# Patient Record
Sex: Female | Born: 2000 | Race: Black or African American | Hispanic: No | Marital: Single | Smoking: Never smoker
Health system: Southern US, Community
[De-identification: ages and names within clinical notes are randomized; demographics above are authoritative.]

## PROBLEM LIST (undated history)

## (undated) ENCOUNTER — Ambulatory Visit: Admission: EM | Source: Home / Self Care

## (undated) DIAGNOSIS — D649 Anemia, unspecified: Secondary | ICD-10-CM

## (undated) HISTORY — DX: Anemia, unspecified: D64.9

---
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
## 2019-11-17 ENCOUNTER — Encounter: Payer: Self-pay | Admitting: *Deleted

## 2019-12-24 ENCOUNTER — Other Ambulatory Visit (HOSPITAL_COMMUNITY)
Admission: RE | Admit: 2019-12-24 | Discharge: 2019-12-24 | Disposition: A | Discharge status: Home / Self Care | Payer: Medicaid Other | Source: Ambulatory Visit | Attending: Obstetrics and Gynecology | Admitting: Obstetrics and Gynecology

## 2019-12-24 ENCOUNTER — Ambulatory Visit (INDEPENDENT_AMBULATORY_CARE_PROVIDER_SITE_OTHER): Payer: Medicaid Other | Admitting: Obstetrics and Gynecology

## 2019-12-24 ENCOUNTER — Other Ambulatory Visit: Payer: Self-pay

## 2019-12-24 DIAGNOSIS — R635 Abnormal weight gain: Secondary | ICD-10-CM

## 2019-12-24 DIAGNOSIS — Z3202 Encounter for pregnancy test, result negative: Secondary | ICD-10-CM | POA: Diagnosis not present

## 2019-12-24 DIAGNOSIS — Z3009 Encounter for other general counseling and advice on contraception: Secondary | ICD-10-CM

## 2019-12-24 DIAGNOSIS — Z113 Encounter for screening for infections with a predominantly sexual mode of transmission: Secondary | ICD-10-CM | POA: Insufficient documentation

## 2019-12-24 LAB — POCT PREGNANCY, URINE: Preg Test, Ur: NEGATIVE

## 2019-12-24 NOTE — Progress Notes (Signed)
Obstetrics and Gynecology New Patient Evaluation  Appointment Date: 12/25/2019  OBGYN Clinic: [facility]  Primary Care Provider: [facility]  Referring Provider: [facility]  Chief Complaint: contraception management  History of Present Illness: Carrie Roman is a 19 y.o. African-American G0P (LMP: amenorrheic on depo provera), seen for the above chief complaint.  Patient referred from PCP for consideration of nexplanon. She has no contraindications to estrogen therapy   Review of Systems: Pertinent items are noted in HPI.   Past Medical History:  No past medical history on file.  Past Surgical History:  No past surgical history on file.  Past Obstetrical History:  OB History  Gravida Para Term Preterm AB Living  0 0 0 0 0 0  SAB TAB Ectopic Multiple Live Births  0 0 0 0 0    Past Gynecological History: As per HPI. Periods: rare with depo provera History of Pap Smear(s): No History of STI(s): No. She is currently using Depo-Provera for contraception. She only has used depo provera which she has been on for 2017; initially started for heavy and painful periods but she also uses it contraception now. She has gained weight and believes depo is contributing to it.    Social History:  Social History   Socioeconomic History  . Marital status: Single    Spouse name: Not on file  . Number of children: Not on file  . Years of education: Not on file  . Highest education level: Not on file  Occupational History  . Not on file  Tobacco Use  . Smoking status: Never Smoker  Substance and Sexual Activity  . Alcohol use: No  . Drug use: No  . Sexual activity: Not on file  Other Topics Concern  . Not on file  Social History Narrative  . Not on file   Social Determinants of Health   Financial Resource Strain:   . Difficulty of Paying Living Expenses:   Food Insecurity: No Food Insecurity   . Worried About Programme researcher, broadcasting/film/video in the Last Year: Never true  . Ran Out of Food in the Last Year: Never true  Transportation Needs: No Transportation Needs  . Lack of Transportation (Medical): No  . Lack of Transportation (Non-Medical): No  Physical Activity:   . Days of Exercise per Week:   . Minutes of Exercise per Session:   Stress:   . Feeling of Stress :   Social Connections:   . Frequency of Communication with Friends and Family:   . Frequency of Social Gatherings with Friends and Family:   . Attends Religious Services:   . Active Member of Clubs or Organizations:   . Attends Banker Meetings:   [name]:   Intimate Partner Violence:   . Fear of Current or Ex-Partner:   . Emotionally Abused:   Marland Kitchen Physically Abused:   . Sexually Abused:     Family History:  She denies any female cancers, bleeding or blood clotting disorders.   Medications Carrie Roman had no medications administered during this visit. No current outpatient medications on file.   No current facility-administered medications for this visit.    Allergies Septra [sulfamethoxazole-trimethoprim]   Physical Exam:  There were no vitals taken for this visit. There is no height or weight on file to calculate BMI. General appearance: Well nourished, well developed female in no acute distress.  Respiratory:  Normal respiratory  effort Neuro/Psych:  Normal mood and affect.  Skin:  Warm and dry.   Laboratory: none  Radiology: none  Assessment: pt doing well  Plan:  1. Screen for STD (sexually transmitted disease) Self swab done today - Cervicovaginal ancillary only( [place]) - HIV antibody (with reflex) - Hepatitis B Surface AntiGEN - Hepatitis C antibody, reflex - RPR  2. Weight gain - TSH - Hemoglobin A1c - Lipid panel  3. Encounter for other general counseling or advice on contraception Follow up vital signs Pt would like to do nexplanon.. she got depo in  April and isn't due for repeat dose until next month and pt would like to wait until then for nexplanon.   Orders Placed This Encounter  Procedures  . HIV antibody (with reflex)  . Hepatitis B Surface AntiGEN  . Hepatitis C antibody, reflex  . RPR  . TSH  . Hemoglobin A1c  . Lipid panel  . Pregnancy, urine POC    RTC 81m for nexplanon  Carrie Romans MD Attending Center for [facility])

## 2019-12-25 ENCOUNTER — Encounter: Payer: Self-pay | Admitting: Obstetrics and Gynecology

## 2019-12-25 DIAGNOSIS — R7303 Prediabetes: Secondary | ICD-10-CM | POA: Insufficient documentation

## 2019-12-25 DIAGNOSIS — E785 Hyperlipidemia, unspecified: Secondary | ICD-10-CM | POA: Insufficient documentation

## 2019-12-25 DIAGNOSIS — E786 Lipoprotein deficiency: Secondary | ICD-10-CM | POA: Insufficient documentation

## 2019-12-25 LAB — CERVICOVAGINAL ANCILLARY ONLY
Bacterial Vaginitis (gardnerella): NEGATIVE
Candida Glabrata: NEGATIVE
Candida Vaginitis: NEGATIVE
Chlamydia: NEGATIVE
Comment: NEGATIVE
Comment: NEGATIVE
Comment: NEGATIVE
Comment: NEGATIVE
Comment: NEGATIVE
Comment: NORMAL
Neisseria Gonorrhea: NEGATIVE
Trichomonas: NEGATIVE

## 2019-12-25 LAB — LIPID PANEL
Chol/HDL Ratio: 4.5 ratio — ABNORMAL HIGH (ref 0.0–4.4)
Cholesterol, Total: 153 mg/dL (ref 100–199)
HDL: 34 mg/dL — ABNORMAL LOW (ref >39)
LDL Chol Calc (NIH): 100 mg/dL — ABNORMAL HIGH (ref 0–99)
Triglycerides: 103 mg/dL (ref 0–149)
VLDL Cholesterol Cal: 19 mg/dL (ref 5–40)

## 2019-12-25 LAB — HEMOGLOBIN A1C
Est. average glucose Bld gHb Est-mCnc: 117 mg/dL
Hgb A1c MFr Bld: 5.7 % — ABNORMAL HIGH (ref 4.8–5.6)

## 2019-12-25 LAB — RPR: RPR Ser Ql: NONREACTIVE

## 2019-12-25 LAB — HEPATITIS B SURFACE ANTIGEN: Hepatitis B Surface Ag: NEGATIVE

## 2019-12-25 LAB — HIV ANTIBODY (ROUTINE TESTING W REFLEX): HIV Screen 4th Generation wRfx: NONREACTIVE

## 2019-12-25 LAB — TSH: TSH: 1.67 u[IU]/mL (ref 0.450–4.500)

## 2020-01-30 ENCOUNTER — Ambulatory Visit: Payer: Medicaid Other | Admitting: Obstetrics and Gynecology

## 2020-07-21 ENCOUNTER — Other Ambulatory Visit: Payer: Self-pay

## 2020-07-21 ENCOUNTER — Ambulatory Visit
Admission: EM | Admit: 2020-07-21 | Discharge: 2020-07-21 | Disposition: A | Discharge status: Home / Self Care | Payer: Medicaid Other | Attending: Emergency Medicine | Admitting: Emergency Medicine

## 2020-07-21 DIAGNOSIS — R0981 Nasal congestion: Secondary | ICD-10-CM

## 2020-07-21 DIAGNOSIS — J069 Acute upper respiratory infection, unspecified: Secondary | ICD-10-CM | POA: Diagnosis not present

## 2020-07-21 DIAGNOSIS — Z1152 Encounter for screening for COVID-19: Secondary | ICD-10-CM | POA: Diagnosis not present

## 2020-07-21 MED ORDER — FLUTICASONE PROPIONATE 50 MCG/ACT NA SUSP: 1.0000 | Freq: Every day | NASAL | 0 refills | Status: DC

## 2020-07-21 NOTE — ED Triage Notes (Signed)
Pt presents to Urgent Care with c/o sore throat x 2 days and nasal congestion and HA since yesterday.

## 2020-07-21 NOTE — ED Provider Notes (Signed)
[facility]    CSN: [id number] Arrival date & time: 07/21/20  0849      History   Chief Complaint Chief Complaint  Patient presents with   Sore Throat   Nasal Congestion    HPI Carrie Roman is a 19 y.o. female presenting today for evaluation of sore throat and congestion.  Reports she has had symptoms for the past 2 days.  Sore throat has improved, but congestion has persisted.  Mild associated headaches.  Denies cough chest pain or shortness of breath.  Denies fevers chills or body aches.  Denies known Covid exposure.  HPI  No past medical history on file.  Patient Active Problem List   Diagnosis Date Noted   Pre-diabetes 12/25/2019   Hyperlipidemia 12/25/2019   Low HDL (under 40) 12/25/2019    No past surgical history on file.  OB History    Gravida  0   Para  0   Term  0   Preterm  0   AB  0   Living  0     SAB  0   IAB  0   Ectopic  0   Multiple  0   Live Births  0            Home Medications    Prior to Admission medications   Medication Sig Start Date End Date Taking? Authorizing Provider  fluticasone (FLONASE) 50 MCG/ACT nasal spray Place 1-2 sprays into both nostrils daily. 07/21/20  Yes Torrion Witter, Junius Creamer, PA-C    Family History Family History  Problem Relation Age of Onset   Asthma Mother    Healthy Father     Social History Social History   Tobacco Use   Smoking status: Never Smoker   Smokeless tobacco: Former Forensic psychologist Use: Never used  Substance Use Topics   Alcohol use: No   Drug use: No     Allergies   Septra [sulfamethoxazole-trimethoprim]   Review of Systems Review of Systems  Constitutional: Negative for activity change, appetite change, chills, fatigue and fever.  HENT: Positive for congestion, rhinorrhea, sinus pressure and sore throat. Negative for ear pain and trouble swallowing.   Eyes: Negative for discharge and redness.  Respiratory: Negative for chest  tightness and shortness of breath.   Cardiovascular: Negative for chest pain.  Gastrointestinal: Negative for abdominal pain, diarrhea, nausea and vomiting.  Musculoskeletal: Negative for myalgias.  Skin: Negative for rash.  Neurological: Positive for headaches. Negative for dizziness and light-headedness.     Physical Exam Triage Vital Signs ED Triage Vitals  Enc Vitals Group     BP 07/21/20 1015 120/75     Pulse Rate 07/21/20 1015 97     Resp 07/21/20 1015 20     Temp 07/21/20 1015 98.3 F (36.8 C)     Temp Source 07/21/20 1015 Oral     SpO2 07/21/20 1015 96 %     Weight 07/21/20 1020 220 lb (99.8 kg)     Height 07/21/20 1020 5\' 8"  (1.727 m)     Head Circumference --      Peak Flow --      Pain Score 07/21/20 1020 0     Pain Loc --      Pain Edu? --      Excl. in GC? --    No data found.  Updated Vital Signs BP 120/75 (BP Location: Left Arm)    Pulse 97    Temp  98.3 F (36.8 C) (Oral)    Resp 20    Ht 5\' 8"  (1.727 m)    Wt 220 lb (99.8 kg)    LMP  (LMP Unknown) Comment: Stopped Depo in October 2021. No regular periods while using, only spotting. Last spotting in August 2021.   SpO2 96%    BMI 33.45 kg/m   Visual Acuity Right Eye Distance:   Left Eye Distance:   Bilateral Distance:    Right Eye Near:   Left Eye Near:    Bilateral Near:     Physical Exam Vitals and nursing note reviewed.  Constitutional:      Appearance: She is well-developed and well-nourished.     Comments: No acute distress  HENT:     Head: Normocephalic and atraumatic.     Ears:     Comments: Bilateral ears without tenderness to palpation of external auricle, tragus and mastoid, EAC's without erythema or swelling, TM's with good bony landmarks and cone of light. Non erythematous.     Nose: Nose normal.     Mouth/Throat:     Comments: Oral mucosa pink and moist, no tonsillar enlargement or exudate. Posterior pharynx patent and nonerythematous, no uvula deviation or swelling. Normal  phonation. Eyes:     Conjunctiva/sclera: Conjunctivae normal.  Cardiovascular:     Rate and Rhythm: Normal rate.  Pulmonary:     Effort: Pulmonary effort is normal. No respiratory distress.     Comments: Breathing comfortably at rest, CTABL, no wheezing, rales or other adventitious sounds auscultated Abdominal:     General: There is no distension.  Musculoskeletal:        General: Normal range of motion.     Cervical back: Neck supple.  Skin:    General: Skin is warm and dry.  Neurological:     Mental Status: She is alert and oriented to person, place, and time.  Psychiatric:        Mood and Affect: Mood and affect normal.      UC Treatments / Results  Labs (all labs ordered are listed, but only abnormal results are displayed) Labs Reviewed  NOVEL CORONAVIRUS, NAA    EKG   Radiology No results found.  Procedures Procedures (including critical care time)  Medications Ordered in UC Medications - No data to display  Initial Impression / Assessment and Plan / UC Course  I have reviewed the triage vital signs and the nursing notes.  Pertinent labs & imaging results that were available during my care of the patient were reviewed by me and considered in my medical decision making (see chart for details).     Covid test pending, recommending symptomatic and supportive care for nasal congestion, suspect likely viral etiology.  Monitor for gradual resolution.  Discussed strict return precautions. Patient verbalized understanding and is agreeable with plan.  Final Clinical Impressions(s) / UC Diagnoses   Final diagnoses:  Encounter for screening for COVID-19  Nasal congestion  Viral URI     Discharge Instructions     Covid test pending, monitor my chart for results Flonase nasal spray for congestion or may use over-the-counter Sudafed, Mucinex, Zyrtec or Claritin as Follow-up if not improving or worsening    ED Prescriptions    Medication Sig Dispense Auth.  Provider   fluticasone (FLONASE) 50 MCG/ACT nasal spray Place 1-2 sprays into both nostrils daily. 16 g Netta Fodge, Nageezi C, PA-C     PDMP not reviewed this encounter.   Villa Rodriguez, PA-C 07/21/20 1105

## 2020-07-21 NOTE — Discharge Instructions (Signed)
Covid test pending, monitor my chart for results Flonase nasal spray for congestion or may use over-the-counter Sudafed, Mucinex, Zyrtec or Claritin as Follow-up if not improving or worsening

## 2020-07-22 LAB — NOVEL CORONAVIRUS, NAA: SARS-CoV-2, NAA: DETECTED — AB

## 2020-07-22 LAB — SARS-COV-2, NAA 2 DAY TAT

## 2020-09-30 ENCOUNTER — Other Ambulatory Visit: Payer: Self-pay

## 2020-09-30 ENCOUNTER — Ambulatory Visit (HOSPITAL_COMMUNITY)
Admission: EM | Admit: 2020-09-30 | Discharge: 2020-09-30 | Disposition: A | Discharge status: Home / Self Care | Payer: Medicaid Other | Attending: Student | Admitting: Student

## 2020-09-30 ENCOUNTER — Encounter (HOSPITAL_COMMUNITY): Payer: Self-pay

## 2020-09-30 DIAGNOSIS — J029 Acute pharyngitis, unspecified: Secondary | ICD-10-CM | POA: Diagnosis not present

## 2020-09-30 DIAGNOSIS — Z113 Encounter for screening for infections with a predominantly sexual mode of transmission: Secondary | ICD-10-CM | POA: Diagnosis present

## 2020-09-30 DIAGNOSIS — Z8616 Personal history of COVID-19: Secondary | ICD-10-CM

## 2020-09-30 DIAGNOSIS — R7303 Prediabetes: Secondary | ICD-10-CM

## 2020-09-30 DIAGNOSIS — Z3202 Encounter for pregnancy test, result negative: Secondary | ICD-10-CM

## 2020-09-30 DIAGNOSIS — Z112 Encounter for screening for other bacterial diseases: Secondary | ICD-10-CM

## 2020-09-30 LAB — POC URINE PREG, ED: Preg Test, Ur: NEGATIVE

## 2020-09-30 LAB — POCT RAPID STREP A, ED / UC: Streptococcus, Group A Screen (Direct): NEGATIVE

## 2020-09-30 LAB — HIV ANTIBODY (ROUTINE TESTING W REFLEX): HIV Screen 4th Generation wRfx: NONREACTIVE

## 2020-09-30 MED ORDER — PREDNISONE 20 MG PO TABS: 40.0000 mg | ORAL_TABLET | Freq: Every day | ORAL | 0 refills | Status: AC

## 2020-09-30 NOTE — ED Provider Notes (Signed)
[facility]    CSN: [id number] Arrival date & time: 09/30/20  1817      History   Chief Complaint Chief Complaint  Patient presents with  . Sore Throat  . Exposure to STD    HPI Carrie Roman is a 20 y.o. female presenting with URI symptoms and STI check. History prediabetes, hyperlipidemia.  -Notes sore throat and loss of voice x3 days. Difficulty swallowing. Denies fevers/chills, n/v/d, shortness of breath, chest pain, cough, congestion, facial pain, teeth pain, headaches, loss of taste/smell, swollen lymph nodes, ear pain. Notes history covid-19 3 years ago. -She was stopped on Depo 5 months ago (04/2020) and has not had a period since then. Requesting pregnancy test today. -Requesting STI panel today. Denies symptoms, exposure. Denies hematuria, dysuria, frequency, urgency, back pain, n/v/d/abd pain, fevers/chills, abdnormal vaginal discharge.     HPI  History reviewed. No pertinent past medical history.  Patient Active Problem List   Diagnosis Date Noted  . Pre-diabetes 12/25/2019  . Hyperlipidemia 12/25/2019  . Low HDL (under 40) 12/25/2019    History reviewed. No pertinent surgical history.  OB History    Gravida  0   Para  0   Term  0   Preterm  0   AB  0   Living  0     SAB  0   IAB  0   Ectopic  0   Multiple  0   Live Births  0            Home Medications    Prior to Admission medications   Medication Sig Start Date End Date Taking? Authorizing Provider  predniSONE (DELTASONE) 20 MG tablet Take 2 tablets (40 mg total) by mouth daily for 5 days. 09/30/20 10/05/20 Yes Rhys Martini, PA-C  fluticasone (FLONASE) 50 MCG/ACT nasal spray Place 1-2 sprays into both nostrils daily. 07/21/20   Wieters, Junius Creamer, PA-C    Family History Family History  Problem Relation Age of Onset  . Asthma Mother   . Healthy Father     Social History Social History   Tobacco Use  . Smoking status: Never Smoker  . Smokeless tobacco:  Former Clinical biochemist  . Vaping Use: Never used  Substance Use Topics  . Alcohol use: No  . Drug use: No     Allergies   Septra [sulfamethoxazole-trimethoprim]   Review of Systems Review of Systems  Constitutional: Negative for appetite change, chills and fever.  HENT: Positive for sore throat and trouble swallowing. Negative for congestion, ear pain, rhinorrhea, sinus pressure, sinus pain and voice change.   Eyes: Negative for redness and visual disturbance.  Respiratory: Negative for cough, chest tightness, shortness of breath and wheezing.   Cardiovascular: Negative for chest pain and palpitations.  Gastrointestinal: Negative for abdominal pain, constipation, diarrhea, nausea and vomiting.  Genitourinary: Negative for dysuria, frequency and urgency.  Musculoskeletal: Negative for myalgias.  Neurological: Negative for dizziness, weakness and headaches.  Psychiatric/Behavioral: Negative for confusion.  All other systems reviewed and are negative.    Physical Exam Triage Vital Signs ED Triage Vitals  Enc Vitals Group     BP      Pulse      Resp      Temp      Temp src      SpO2      Weight      Height      Head Circumference      Peak Flow  Pain Score      Pain Loc      Pain Edu?      Excl. in GC?    No data found.  Updated Vital Signs BP 108/72 (BP Location: Left Arm)   Pulse 100   Temp 99.7 F (37.6 C) (Oral)   Resp 16   LMP  (LMP Unknown)   SpO2 100%   Visual Acuity Right Eye Distance:   Left Eye Distance:   Bilateral Distance:    Right Eye Near:   Left Eye Near:    Bilateral Near:     Physical Exam Vitals reviewed.  Constitutional:      General: She is not in acute distress.    Appearance: Normal appearance. She is not ill-appearing.  HENT:     Head: Normocephalic and atraumatic.     Right Ear: Hearing, tympanic membrane, ear canal and external ear normal. No swelling or tenderness. There is no impacted cerumen. No mastoid  tenderness. Tympanic membrane is not perforated, erythematous, retracted or bulging.     Left Ear: Hearing, tympanic membrane, ear canal and external ear normal. No swelling or tenderness. There is no impacted cerumen. No mastoid tenderness. Tympanic membrane is not perforated, erythematous, retracted or bulging.     Nose:     Right Sinus: No maxillary sinus tenderness or frontal sinus tenderness.     Left Sinus: No maxillary sinus tenderness or frontal sinus tenderness.     Mouth/Throat:     Mouth: Mucous membranes are moist.     Pharynx: Oropharynx is clear. Uvula midline. Posterior oropharyngeal erythema present. No oropharyngeal exudate.     Tonsils: No tonsillar exudate. 2+ on the right. 2+ on the left.     Comments: No pharyngeal swelling, discharge, abscess. Cardiovascular:     Rate and Rhythm: Normal rate and regular rhythm.     Heart sounds: Normal heart sounds.  Pulmonary:     Breath sounds: Normal breath sounds and air entry. No wheezing, rhonchi or rales.  Chest:     Chest wall: No tenderness.  Abdominal:     General: Abdomen is flat. Bowel sounds are normal.     Tenderness: There is no abdominal tenderness. There is no guarding or rebound.  Lymphadenopathy:     Cervical: No cervical adenopathy.  Neurological:     General: No focal deficit present.     Mental Status: She is alert and oriented to person, place, and time.  Psychiatric:        Attention and Perception: Attention and perception normal.        Mood and Affect: Mood and affect normal.        Behavior: Behavior normal. Behavior is cooperative.        Thought Content: Thought content normal.        Judgment: Judgment normal.      UC Treatments / Results  Labs (all labs ordered are listed, but only abnormal results are displayed) Labs Reviewed  CULTURE, GROUP A STREP ([facility])  RPR  HIV ANTIBODY (ROUTINE TESTING W REFLEX)  POC URINE PREG, ED  POCT RAPID STREP A, ED / UC    EKG   Radiology No results  found.  Procedures Procedures (including critical care time)  Medications Ordered in UC Medications - No data to display  Initial Impression / Assessment and Plan / UC Course  I have reviewed the triage vital signs and the nursing notes.  Pertinent labs & imaging results that were available during my  care of the patient were reviewed by me and considered in my medical decision making (see chart for details).      This patient is a 20 year old female presenting with pharyngitis and STI screen. Today she is  afebrile nontachycardic nontachypneic, oxygenating well on room air.   Rapid strep negative. Culture sent. covid deferred given history covid-19 infection within the last 3 months.  For pharyngitis, plan to treat with prednisone as below. Prediabetes is well controlled with last A1c of 5.7.  Tylenol and ibuprofen for discomfort.  Will send self swab for G/C, trich, yeast, BV, HIV, RPR. Patient denies STD symptoms or risk. Abstain until negative result.  Urine pregnancy negative.  Return precautions discussed.  This chart was dictated using voice recognition software, Dragon. Despite the best efforts of this provider to proofread and correct errors, errors may still occur which can change documentation meaning.   Final Clinical Impressions(s) / UC Diagnoses   Final diagnoses:  Viral pharyngitis  Screening for streptococcal infection  History of 2019 novel coronavirus disease (COVID-19)  Routine screening for STI (sexually transmitted infection)  Negative pregnancy test  Prediabetes     Discharge Instructions     -Start the steroid-prednisone [facility]), 2 pills taken together in the morning for 5 days in a row.  This will help with the swelling and inflammation of your throat and tonsils -You can also take Tylenol and ibuprofen for discomfort.  You can take ibuprofen up to 800 mg 3 times daily with food, and Tylenol 1000 mg up to 3 times daily. -Come back and see [place]  if you develop worsening symptoms, like shortness of breath, fevers and chills that won't  be reduced by Tylenol, chest pain, abdominal pain, etc. -We are also screening you for gonorrhea, chlamydia, trichomonas, BV, yeast, HIV, syphilis.  We will call you if anything is positive, and can send treatment at that time.  The results will also automatically go to your MyChart. -Your pregnancy test is negative today.    ED Prescriptions    Medication Sig Dispense Auth. Provider   predniSONE (DELTASONE) 20 MG tablet Take 2 tablets (40 mg total) by mouth daily for 5 days. 10 tablet Rhys Martini, PA-C     PDMP not reviewed this encounter.   Rhys Martini, PA-C 09/30/20 1942

## 2020-09-30 NOTE — Discharge Instructions (Addendum)
-  Start the steroid-prednisone [facility]), 2 pills taken together in the morning for 5 days in a row.  This will help with the swelling and inflammation of your throat and tonsils -You can also take Tylenol and ibuprofen for discomfort.  You can take ibuprofen up to 800 mg 3 times daily with food, and Tylenol 1000 mg up to 3 times daily. -Come back and see [place] if you develop worsening symptoms, like shortness of breath, fevers and chills that won't  be reduced by Tylenol, chest pain, abdominal pain, etc. -We are also screening you for gonorrhea, chlamydia, trichomonas, BV, yeast, HIV, syphilis.  We will call you if anything is positive, and can send treatment at that time.  The results will also automatically go to your MyChart. -Your pregnancy test is negative today.

## 2020-09-30 NOTE — ED Triage Notes (Signed)
Pt present a sore throat for a couple of days with loss voice. Pt states having difficulty swallowing. She also would like to be check for any STD. Pt denies any exposure but would like to be checked.

## 2020-10-01 LAB — CERVICOVAGINAL ANCILLARY ONLY
Bacterial Vaginitis (gardnerella): NEGATIVE
Candida Glabrata: NEGATIVE
Candida Vaginitis: NEGATIVE
Chlamydia: NEGATIVE
Comment: NEGATIVE
Comment: NEGATIVE
Comment: NEGATIVE
Comment: NEGATIVE
Comment: NEGATIVE
Comment: NORMAL
Neisseria Gonorrhea: NEGATIVE
Trichomonas: NEGATIVE

## 2020-10-01 LAB — CULTURE, GROUP A STREP (THRC)

## 2020-10-01 LAB — RPR: RPR Ser Ql: NONREACTIVE

## 2020-10-02 LAB — CULTURE, GROUP A STREP (THRC)

## 2020-10-03 LAB — CULTURE, GROUP A STREP (THRC)

## 2020-11-07 ENCOUNTER — Ambulatory Visit (HOSPITAL_COMMUNITY)
Admission: EM | Admit: 2020-11-07 | Discharge: 2020-11-07 | Disposition: A | Discharge status: Home / Self Care | Payer: Medicaid Other | Attending: Urgent Care | Admitting: Urgent Care

## 2020-11-07 ENCOUNTER — Encounter (HOSPITAL_COMMUNITY): Payer: Self-pay

## 2020-11-07 ENCOUNTER — Other Ambulatory Visit: Payer: Self-pay

## 2020-11-07 DIAGNOSIS — J069 Acute upper respiratory infection, unspecified: Secondary | ICD-10-CM | POA: Diagnosis not present

## 2020-11-07 DIAGNOSIS — R059 Cough, unspecified: Secondary | ICD-10-CM | POA: Diagnosis not present

## 2020-11-07 DIAGNOSIS — U071 COVID-19: Secondary | ICD-10-CM | POA: Insufficient documentation

## 2020-11-07 DIAGNOSIS — R0982 Postnasal drip: Secondary | ICD-10-CM | POA: Diagnosis not present

## 2020-11-07 DIAGNOSIS — R1110 Vomiting, unspecified: Secondary | ICD-10-CM | POA: Diagnosis present

## 2020-11-07 MED ORDER — PSEUDOEPHEDRINE HCL 60 MG PO TABS: 60.0000 mg | ORAL_TABLET | Freq: Three times a day (TID) | ORAL | 0 refills | Status: DC | PRN

## 2020-11-07 MED ORDER — PROMETHAZINE-DM 6.25-15 MG/5ML PO SYRP: 5.0000 mL | ORAL_SOLUTION | Freq: Every evening | ORAL | 0 refills | Status: DC | PRN

## 2020-11-07 MED ORDER — CETIRIZINE HCL 10 MG PO TABS: 10.0000 mg | ORAL_TABLET | Freq: Every day | ORAL | 0 refills | Status: DC

## 2020-11-07 MED ORDER — FLUTICASONE PROPIONATE 50 MCG/ACT NA SUSP: 1.0000 | Freq: Every day | NASAL | 0 refills | Status: DC

## 2020-11-07 NOTE — ED Triage Notes (Signed)
Pt presents with non productive cough, sore throat, and vomiting since last night.

## 2020-11-07 NOTE — Discharge Instructions (Addendum)

## 2020-11-07 NOTE — ED Provider Notes (Signed)
[facility]   MRN: [id number] DOB: 2001-04-19  Subjective:   Carrie Roman is a 20 y.o. female presenting for acute onset of dry hacking cough, throat pain, scratchy throat, post office emesis.  States that she was seen about a month ago, was given a steroid course which helped temporarily.  She was tested for strep and was negative.  Denies chest pain, shortness of breath.  No history of respiratory disorders, asthma, she is not a smoker.  She did take an at home COVID test and was negative.  Would like to make sure that is correct.  She is COVID vaccinated but no booster. Has not had fevers or body aches.    No current facility-administered medications for this encounter.  Current Outpatient Medications:  .  fluticasone (FLONASE) 50 MCG/ACT nasal spray, Place 1-2 sprays into both nostrils daily., Disp: 16 g, Rfl: 0   Allergies  Allergen Reactions  . Septra [Sulfamethoxazole-Trimethoprim] Rash    History reviewed. No pertinent past medical history.   History reviewed. No pertinent surgical history.  Family History  Problem Relation Age of Onset  . Asthma Mother   . Healthy Father     Social History   Tobacco Use  . Smoking status: Never Smoker  . Smokeless tobacco: Former Clinical biochemist  . Vaping Use: Never used  Substance Use Topics  . Alcohol use: No  . Drug use: No    ROS   Objective:   Vitals: BP 133/80 (BP Location: Right Arm)   Pulse 86   Temp 98.9 F (37.2 C) (Oral)   Resp 17   SpO2 94%   Physical Exam Constitutional:      General: She is not in acute distress.    Appearance: Normal appearance. She is well-developed. She is not ill-appearing, toxic-appearing or diaphoretic.  HENT:     Head: Normocephalic and atraumatic.     Right Ear: External ear normal.     Left Ear: External ear normal.     Nose: Nose normal.     Mouth/Throat:     Mouth: Mucous membranes are moist.     Pharynx: No oropharyngeal exudate or posterior  oropharyngeal erythema.     Comments: Thick layer of post-nasal drainage overlying pharynx.  Eyes:     General: No scleral icterus.       Right eye: No discharge.        Left eye: No discharge.     Extraocular Movements: Extraocular movements intact.     Conjunctiva/sclera: Conjunctivae normal.     Pupils: Pupils are equal, round, and reactive to light.  Cardiovascular:     Rate and Rhythm: Normal rate and regular rhythm.     Pulses: Normal pulses.     Heart sounds: Normal heart sounds. No murmur heard. No friction rub. No gallop.   Pulmonary:     Effort: Pulmonary effort is normal. No respiratory distress.     Breath sounds: Normal breath sounds. No stridor. No wheezing, rhonchi or rales.  Skin:    General: Skin is warm and dry.     Findings: No rash.  Neurological:     Mental Status: She is alert and oriented to person, place, and time.  Psychiatric:        Mood and Affect: Mood normal.        Behavior: Behavior normal.        Thought Content: Thought content normal.        Judgment:  Judgment normal.     Assessment and Plan :   PDMP not reviewed this encounter.  1. Viral URI with cough   2. Post-nasal drainage     Will manage for viral illness such as viral URI, viral syndrome, viral rhinitis, COVID-19. Counseled patient on nature of COVID-19 including modes of transmission, diagnostic testing, management and supportive care.  Offered scripts for symptomatic relief. COVID 19 testing is pending. Counseled patient on potential for adverse effects with medications prescribed/recommended today, ER and return-to-clinic precautions discussed, patient verbalized understanding.     Wallis Bamberg, [place] 11/07/20 [phone]

## 2020-11-08 LAB — SARS CORONAVIRUS 2 (TAT 6-24 HRS): SARS Coronavirus 2: POSITIVE — AB

## 2020-11-09 ENCOUNTER — Telehealth: Payer: Self-pay

## 2020-11-09 NOTE — Telephone Encounter (Signed)
Called to discuss with patient about COVID-19 symptoms and the use of one of the available treatments for those with mild to moderate Covid symptoms and at a high risk of hospitalization.  Pt appears to qualify for outpatient treatment due to co-morbid conditions and/or a member of an at-risk group in accordance with the FDA Emergency Use Authorization.    Symptom onset: 11/07/20 Cough Vaccinated: Yes Booster? No Immunocompromised? No Qualifiers: Obesity  Unable to reach pt - Voice mailbox not set up, unable to leave message.   Esther Hardy

## 2021-05-07 ENCOUNTER — Other Ambulatory Visit: Payer: Self-pay

## 2021-05-07 ENCOUNTER — Emergency Department (HOSPITAL_COMMUNITY)
Admission: EM | Admit: 2021-05-07 | Discharge: 2021-05-07 | Discharge status: Against Medical Advice | Payer: Medicaid Other | Attending: Medical | Admitting: Medical

## 2021-05-07 ENCOUNTER — Emergency Department (HOSPITAL_COMMUNITY): Payer: Medicaid Other

## 2021-05-07 ENCOUNTER — Encounter (HOSPITAL_COMMUNITY): Payer: Self-pay | Admitting: Emergency Medicine

## 2021-05-07 ENCOUNTER — Other Ambulatory Visit (HOSPITAL_COMMUNITY): Payer: Self-pay

## 2021-05-07 DIAGNOSIS — R102 Pelvic and perineal pain: Secondary | ICD-10-CM | POA: Insufficient documentation

## 2021-05-07 DIAGNOSIS — Z5321 Procedure and treatment not carried out due to patient leaving prior to being seen by health care provider: Secondary | ICD-10-CM | POA: Insufficient documentation

## 2021-05-07 DIAGNOSIS — N939 Abnormal uterine and vaginal bleeding, unspecified: Secondary | ICD-10-CM | POA: Insufficient documentation

## 2021-05-07 LAB — I-STAT BETA HCG BLOOD, ED (MC, WL, AP ONLY): I-stat hCG, quantitative: <5 m[IU]/mL (ref <5)

## 2021-05-07 LAB — BASIC METABOLIC PANEL
Anion gap: 5 (ref 5–15)
BUN: 10 mg/dL (ref 6–20)
CO2: 24 mmol/L (ref 22–32)
Calcium: 9.3 mg/dL (ref 8.9–10.3)
Chloride: 107 mmol/L (ref 98–111)
Creatinine, Ser: 0.89 mg/dL (ref 0.44–1.00)
GFR, Estimated: >60 mL/min (ref >60)
Glucose, Bld: 94 mg/dL (ref 70–99)
Potassium: 3.7 mmol/L (ref 3.5–5.1)
Sodium: 136 mmol/L (ref 135–145)

## 2021-05-07 LAB — CBC WITH DIFFERENTIAL/PLATELET
Abs Immature Granulocytes: 0.01 10*3/uL (ref 0.00–0.07)
Basophils Absolute: 0.1 10*3/uL (ref 0.0–0.1)
Basophils Relative: 1 %
Eosinophils Absolute: 0.1 10*3/uL (ref 0.0–0.5)
Eosinophils Relative: 1 %
HCT: 36.5 % (ref 36.0–46.0)
Hemoglobin: 11.4 g/dL — ABNORMAL LOW (ref 12.0–15.0)
Immature Granulocytes: 0 %
Lymphocytes Relative: 39 %
Lymphs Abs: 3.1 10*3/uL (ref 0.7–4.0)
MCH: 26.7 pg (ref 26.0–34.0)
MCHC: 31.2 g/dL (ref 30.0–36.0)
MCV: 85.5 fL (ref 80.0–100.0)
Monocytes Absolute: 0.8 10*3/uL (ref 0.1–1.0)
Monocytes Relative: 10 %
Neutro Abs: 3.9 10*3/uL (ref 1.7–7.7)
Neutrophils Relative %: 49 %
Platelets: 354 10*3/uL (ref 150–400)
RBC: 4.27 MIL/uL (ref 3.87–5.11)
RDW: 13.2 % (ref 11.5–15.5)
WBC: 7.9 10*3/uL (ref 4.0–10.5)
nRBC: 0 % (ref 0.0–0.2)

## 2021-05-07 LAB — TYPE AND SCREEN
ABO/RH(D): O POS
Antibody Screen: NEGATIVE

## 2021-05-07 LAB — HIV ANTIBODY (ROUTINE TESTING W REFLEX): HIV Screen 4th Generation wRfx: NONREACTIVE

## 2021-05-07 NOTE — ED Triage Notes (Signed)
Pt reports heavy vaginal bleeding since yesterday.  Period started on 10/6.  Denies pain.

## 2021-05-07 NOTE — ED Provider Notes (Signed)
Emergency Medicine Provider Triage Evaluation Note  Carrie Roman , a 20 y.o. female  was evaluated in triage.  Pt complains of heavy vaginal bleeding since starting menstrual cycle 10/06. Stopped taking BC about 1 year ago and has been having irregular cycles since then however seems to have leveled out recently. LNMP prior to this one 09/02 however this has lasted longer and is much heavier. Going through 1 super tampon every 2 hours. No dizziness, lightheadedness, chest pain, SOB, pre syncope, syncope. Went to UC and had a negative preg test and sent here for further eval.   Review of Systems  Positive: + vaginal bleeding, cramping Negative: - syncope  Physical Exam  BP 129/71 (BP Location: Left Arm)   Pulse 82   Temp 99 F (37.2 C) (Oral)   Resp 16   SpO2 100%  Gen:   Awake, no distress   Resp:  Normal effort  MSK:   Moves extremities without difficulty  Other:    Medical Decision Making  Medically screening exam initiated at 4:13 PM.  Appropriate orders placed.  Payton Doughty Dygert was informed that the remainder of the evaluation will be completed by another provider, this initial triage assessment does not replace that evaluation, and the importance of remaining in the ED until their evaluation is complete.     Tanda Rockers, PA-C 05/07/21 1615    Benjiman Core, MD 05/07/21 [phone]

## 2021-05-07 NOTE — ED Notes (Signed)
Pt wanted to leave and left AMA.

## 2021-05-08 LAB — RPR: RPR Ser Ql: NONREACTIVE

## 2021-06-23 ENCOUNTER — Ambulatory Visit (INDEPENDENT_AMBULATORY_CARE_PROVIDER_SITE_OTHER): Payer: Medicaid Other | Admitting: Obstetrics and Gynecology

## 2021-06-23 ENCOUNTER — Encounter: Payer: Self-pay | Admitting: Obstetrics and Gynecology

## 2021-06-23 ENCOUNTER — Other Ambulatory Visit (HOSPITAL_COMMUNITY)
Admission: RE | Admit: 2021-06-23 | Discharge: 2021-06-23 | Disposition: A | Discharge status: Home / Self Care | Payer: Medicaid Other | Source: Ambulatory Visit | Attending: Obstetrics and Gynecology | Admitting: Obstetrics and Gynecology

## 2021-06-23 ENCOUNTER — Other Ambulatory Visit: Payer: Self-pay

## 2021-06-23 VITALS — BP 110/74 | HR 97 | Ht 68.0 in | Wt 239.0 lb

## 2021-06-23 DIAGNOSIS — Z01419 Encounter for gynecological examination (general) (routine) without abnormal findings: Secondary | ICD-10-CM

## 2021-06-23 NOTE — Progress Notes (Signed)
Subjective:     Carrie Roman is a 21 y.o. female P0 with LMP 06/16/21 and BMI 36 is here for a comprehensive physical exam. The patient reports no problems. She reports a monthly period lasting 10 days but admits that it varies in length. She was recently seen in the ED in October due to heavier cycle. She reports that per period in November lasted 5 days. She was previously using depo-provera but has discontinued it this past year. Her periods are starting to become a little more regular. Patient denies pelvic pain. She denies frequent headaches, or female pattern hair distribution. She is sexually active currently using condoms. Patient is considering Nexplanon for contraception  Past Medical History:  Diagnosis Date   Anemia    History reviewed. No pertinent surgical history. Family History  Problem Relation Age of Onset   Asthma Mother    Healthy Father     Social History   Socioeconomic History   Marital status: Single    Spouse name: Not on file   Number of children: Not on file   Years of education: Not on file   Highest education level: Not on file  Occupational History   Not on file  Tobacco Use   Smoking status: Never   Smokeless tobacco: Former  Building services engineer Use: Never used  Substance and Sexual Activity   Alcohol use: No   Drug use: No   Sexual activity: Yes    Birth control/protection: None  Other Topics Concern   Not on file  Social History Narrative   Not on file   Social Determinants of Health   Financial Resource Strain: Not on file  Food Insecurity: Not on file  Transportation Needs: Not on file  Physical Activity: Not on file  Stress: Not on file  Social Connections: Not on file  Intimate Partner Violence: Not on file   Health Maintenance  Topic Date Due   COVID-19 Vaccine (1) Never done   HPV VACCINES (1 - 2-dose series) Never done   Hepatitis C Screening  Never done   TETANUS/TDAP  Never done   PAP-Cervical Cytology Screening  Never  done   PAP SMEAR-Modifier  Never done   INFLUENZA VACCINE  Never done   HIV Screening  Completed   Pneumococcal Vaccine 16-40 Years old  Aged Out       Review of Systems Pertinent items noted in HPI and remainder of comprehensive ROS otherwise negative.   Objective:  Blood pressure 110/74, pulse 97, height 5\' 8"  (1.727 m), weight 239 lb (108.4 kg), last menstrual period 06/16/2021.   GENERAL: Well-developed, well-nourished female in no acute distress.  HEENT: Normocephalic, atraumatic. Sclerae anicteric.  NECK: Supple. Normal thyroid.  LUNGS: Clear to auscultation bilaterally.  HEART: Regular rate and rhythm. BREASTS: Symmetric in size. No palpable masses or lymphadenopathy, skin changes, or nipple drainage. ABDOMEN: Soft, nontender, nondistended. No organomegaly. PELVIC: Normal external female genitalia. Vagina is pink and rugated.  Normal discharge. Normal appearing cervix. Uterus is normal in size. No adnexal mass or tenderness. Chaperone present during the pelvic exam EXTREMITIES: No cyanosis, clubbing, or edema, 2+ distal pulses.     Assessment:    Healthy female exam.      Plan:    Pap smear collected STI testing per patient request TSH and CBC ordered Patient will be contacted with abnormal results RTC for Nexplanon insertion if desired See After Visit Summary for Counseling Recommendations

## 2021-06-23 NOTE — Progress Notes (Signed)
Pt states irregular cycles.  Last month pt states bleeding x 3 weeks- heavy.   Pt is not on BC at this time. Last BC was over a year ago- had Depo.

## 2021-06-24 LAB — CERVICOVAGINAL ANCILLARY ONLY
Chlamydia: NEGATIVE
Comment: NEGATIVE
Comment: NORMAL
Neisseria Gonorrhea: NEGATIVE

## 2021-06-24 LAB — CBC
Hematocrit: 34.2 % (ref 34.0–46.6)
Hemoglobin: 10.9 g/dL — ABNORMAL LOW (ref 11.1–15.9)
MCH: 25 pg — ABNORMAL LOW (ref 26.6–33.0)
MCHC: 31.9 g/dL (ref 31.5–35.7)
MCV: 78 fL — ABNORMAL LOW (ref 79–97)
Platelets: 422 10*3/uL (ref 150–450)
RBC: 4.36 x10E6/uL (ref 3.77–5.28)
RDW: 11.9 % (ref 11.7–15.4)
WBC: 9.3 10*3/uL (ref 3.4–10.8)

## 2021-06-24 LAB — HEPATITIS B SURFACE ANTIGEN: Hepatitis B Surface Ag: NEGATIVE

## 2021-06-24 LAB — HIV ANTIBODY (ROUTINE TESTING W REFLEX): HIV Screen 4th Generation wRfx: NONREACTIVE

## 2021-06-24 LAB — TSH: TSH: 1.63 u[IU]/mL (ref 0.450–4.500)

## 2021-06-24 LAB — RPR: RPR Ser Ql: NONREACTIVE

## 2021-06-24 LAB — HEPATITIS C ANTIBODY: Hep C Virus Ab: <0.1 s/co ratio (ref 0.0–0.9)

## 2021-06-27 LAB — CYTOLOGY - PAP: Diagnosis: NEGATIVE

## 2022-01-03 ENCOUNTER — Encounter: Payer: Self-pay | Admitting: Radiology

## 2022-01-10 ENCOUNTER — Encounter: Payer: Self-pay | Admitting: Emergency Medicine

## 2022-01-10 ENCOUNTER — Ambulatory Visit
Admission: EM | Admit: 2022-01-10 | Discharge: 2022-01-10 | Disposition: A | Discharge status: Home / Self Care | Payer: Medicaid Other | Attending: Physician Assistant | Admitting: Physician Assistant

## 2022-01-10 DIAGNOSIS — Z113 Encounter for screening for infections with a predominantly sexual mode of transmission: Secondary | ICD-10-CM | POA: Insufficient documentation

## 2022-01-10 LAB — POCT URINALYSIS DIP (MANUAL ENTRY)
Bilirubin, UA: NEGATIVE
Glucose, UA: NEGATIVE mg/dL
Ketones, POC UA: NEGATIVE mg/dL
Leukocytes, UA: NEGATIVE
Nitrite, UA: NEGATIVE
Protein Ur, POC: NEGATIVE mg/dL
Spec Grav, UA: >=1.03 — AB (ref 1.010–1.025)
Urobilinogen, UA: 0.2 E.U./dL
pH, UA: 6 (ref 5.0–8.0)

## 2022-01-10 LAB — POCT URINE PREGNANCY: Preg Test, Ur: NEGATIVE

## 2022-01-10 NOTE — ED Triage Notes (Signed)
PT here with vaginal irritation and some scant discharge since this morning. Pt states she has unprotected sex with 2 partners and would like STI  testing including bloodwork.

## 2022-01-10 NOTE — ED Provider Notes (Signed)
[facility]    CSN: [id number] Arrival date & time: 01/10/22  1914      History   Chief Complaint Chief Complaint  Patient presents with   Vaginal Irritation    HPI RAWAN RIENDEAU is a 21 y.o. female.   Patient presents today with a 1 day history of vaginal discharge.  She reports this has a color but no odor and is not particularly bothersome.  She is sexually active and has had unprotected sex was interested in complete STI panel.  She denies any specific concern or known exposure.  She is sexually active with female partners and traditionally uses condoms but has not always use condoms prompting evaluation today.  She denies any pelvic pain, abdominal pain, fever, nausea, vomiting.    Past Medical History:  Diagnosis Date   Anemia     Patient Active Problem List   Diagnosis Date Noted   Pre-diabetes 12/25/2019   Hyperlipidemia 12/25/2019   Low HDL (under 40) 12/25/2019    History reviewed. No pertinent surgical history.  OB History     Gravida  0   Para  0   Term  0   Preterm  0   AB  0   Living  0      SAB  0   IAB  0   Ectopic  0   Multiple  0   Live Births  0            Home Medications    Prior to Admission medications   Not on File    Family History Family History  Problem Relation Age of Onset   Asthma Mother    Healthy Father     Social History Social History   Tobacco Use   Smoking status: Never   Smokeless tobacco: Former  Building services engineer Use: Never used  Substance Use Topics   Alcohol use: No   Drug use: No     Allergies   Septra [sulfamethoxazole-trimethoprim]   Review of Systems Review of Systems  Constitutional:  Negative for activity change, appetite change, fatigue and fever.  Gastrointestinal:  Negative for abdominal pain, diarrhea, nausea and vomiting.  Genitourinary:  Positive for vaginal discharge. Negative for dysuria, frequency, urgency, vaginal bleeding and vaginal pain.      Physical Exam Triage Vital Signs ED Triage Vitals  Enc Vitals Group     BP 01/10/22 1934 112/76     Pulse Rate 01/10/22 1934 79     Resp 01/10/22 1934 20     Temp 01/10/22 1934 98.3 F (36.8 C)     Temp src --      SpO2 01/10/22 1934 98 %     Weight --      Height --      Head Circumference --      Peak Flow --      Pain Score 01/10/22 1932 0     Pain Loc --      Pain Edu? --      Excl. in GC? --    No data found.  Updated Vital Signs BP 112/76   Pulse 79   Temp 98.3 F (36.8 C)   Resp 20   SpO2 98%   Visual Acuity Right Eye Distance:   Left Eye Distance:   Bilateral Distance:    Right Eye Near:   Left Eye Near:    Bilateral Near:     Physical Exam Vitals reviewed.  Constitutional:  General: She is awake. She is not in acute distress.    Appearance: Normal appearance. She is well-developed. She is not ill-appearing.     Comments: Very pleasant female appears stated age in no acute distress sitting comfortably in exam room  HENT:     Head: Normocephalic and atraumatic.  Cardiovascular:     Rate and Rhythm: Normal rate and regular rhythm.     Heart sounds: Normal heart sounds, S1 normal and S2 normal. No murmur heard. Pulmonary:     Effort: Pulmonary effort is normal.     Breath sounds: Normal breath sounds. No wheezing, rhonchi or rales.     Comments: Clear to auscultation bilaterally Abdominal:     General: Bowel sounds are normal.     Palpations: Abdomen is soft.     Tenderness: There is no abdominal tenderness. There is no right CVA tenderness, left CVA tenderness, guarding or rebound.     Comments: Benign abdominal exam  Psychiatric:        Behavior: Behavior is cooperative.      UC Treatments / Results  Labs (all labs ordered are listed, but only abnormal results are displayed) Labs Reviewed  POCT URINALYSIS DIP (MANUAL ENTRY) - Abnormal; Notable for the following components:      Result Value   Spec Grav, UA >=1.030 (*)     Blood, UA large (*)    All other components within normal limits  RPR  HIV ANTIBODY (ROUTINE TESTING W REFLEX)  HEPATITIS C ANTIBODY  POCT URINE PREGNANCY  CERVICOVAGINAL ANCILLARY ONLY    EKG   Radiology No results found.  Procedures Procedures (including critical care time)  Medications Ordered in UC Medications - No data to display  Initial Impression / Assessment and Plan / UC Course  I have reviewed the triage vital signs and the nursing notes.  Pertinent labs & imaging results that were available during my care of the patient were reviewed by me and considered in my medical decision making (see chart for details).     Urine pregnancy was negative.  UA showed concentrated urine with some blood and she was encouraged to push fluids and follow-up with PCP.  Recommended repeat UA in a few weeks.  STI swab and testing was collected today-results pending.  Discussed the importance of safe sex practices the patient was given handout on sexual health with condoms.  Discussed that if she develops any worsening symptoms including pelvic pain, abdominal pain, fever, nausea, vomiting she needs to be seen immediately to which she expressed understanding.  Final Clinical Impressions(s) / UC Diagnoses   Final diagnoses:  Routine screening for STI (sexually transmitted infection)     Discharge Instructions      We will contact you if any of your lab work is abnormal.  Please monitor your MyChart for these results.  You did have a little bit of blood in your urine.  I recommend that you have your urine repeated with your primary care provider within a few weeks to ensure this goes away.  Make sure you are drinking lots of fluids.  If you develop any abdominal pain, pelvic pain, fever, nausea, vomiting you need to be seen immediately.  Please abstain from sex until you receive your results.       ED Prescriptions   None    PDMP not reviewed this encounter.   Jeani Hawking,  PA-C 01/10/22 1947

## 2022-01-10 NOTE — Discharge Instructions (Signed)
We will contact you if any of your lab work is abnormal.  Please monitor your MyChart for these results.  You did have a little bit of blood in your urine.  I recommend that you have your urine repeated with your primary care provider within a few weeks to ensure this goes away.  Make sure you are drinking lots of fluids.  If you develop any abdominal pain, pelvic pain, fever, nausea, vomiting you need to be seen immediately.  Please abstain from sex until you receive your results.

## 2022-01-12 LAB — CERVICOVAGINAL ANCILLARY ONLY
Bacterial Vaginitis (gardnerella): NEGATIVE
Candida Glabrata: NEGATIVE
Candida Vaginitis: NEGATIVE
Chlamydia: NEGATIVE
Comment: NEGATIVE
Comment: NEGATIVE
Comment: NEGATIVE
Comment: NEGATIVE
Comment: NEGATIVE
Comment: NORMAL
Neisseria Gonorrhea: NEGATIVE
Trichomonas: NEGATIVE

## 2022-01-12 LAB — HIV ANTIBODY (ROUTINE TESTING W REFLEX): HIV Screen 4th Generation wRfx: NONREACTIVE

## 2022-01-12 LAB — HEPATITIS C ANTIBODY: Hep C Virus Ab: NONREACTIVE

## 2022-01-12 LAB — RPR: RPR Ser Ql: NONREACTIVE

## 2022-01-17 ENCOUNTER — Ambulatory Visit: Payer: Medicaid Other | Admitting: Obstetrics and Gynecology

## 2023-02-12 IMAGING — US US PELVIS COMPLETE TRANSABD/TRANSVAG W DUPLEX
1 series · 13 of 25 positions shown · non-contrast
Comparison: None.

CLINICAL DATA: Vaginal bleeding

EXAM:
TRANSABDOMINAL AND TRANSVAGINAL ULTRASOUND OF PELVIS
DOPPLER ULTRASOUND OF OVARIES
TECHNIQUE: Both transabdominal and transvaginal ultrasound examinations of the
pelvis were performed. Transabdominal technique was performed for
global imaging of the pelvis including uterus, ovaries, adnexal
regions, and pelvic cul-de-sac.
It was necessary to proceed with endovaginal exam following the
transabdominal exam to visualize the uterus, endometrium, ovaries
and adnexa. Color and duplex Doppler ultrasound was utilized to
evaluate blood flow to the ovaries.

[Series 1: us pelvic complete w transvaginal and torsion righ · 73 acquisitions, 13 frames shown]
[im 1/73]
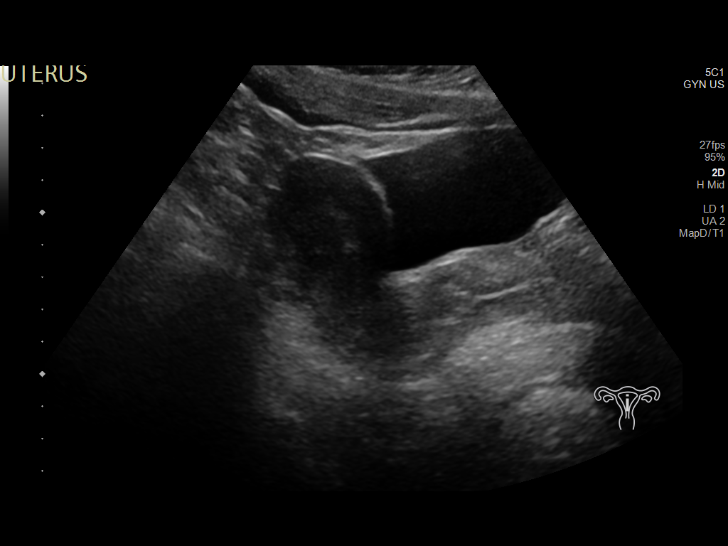
[im 7/73]
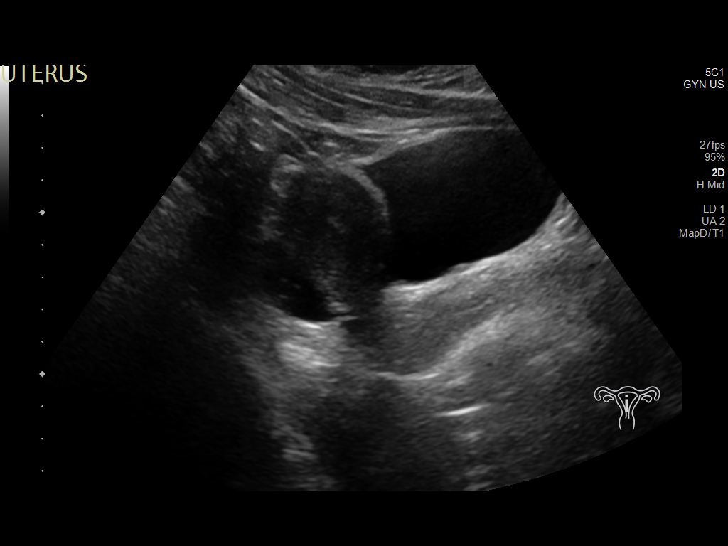
[im 13/73]
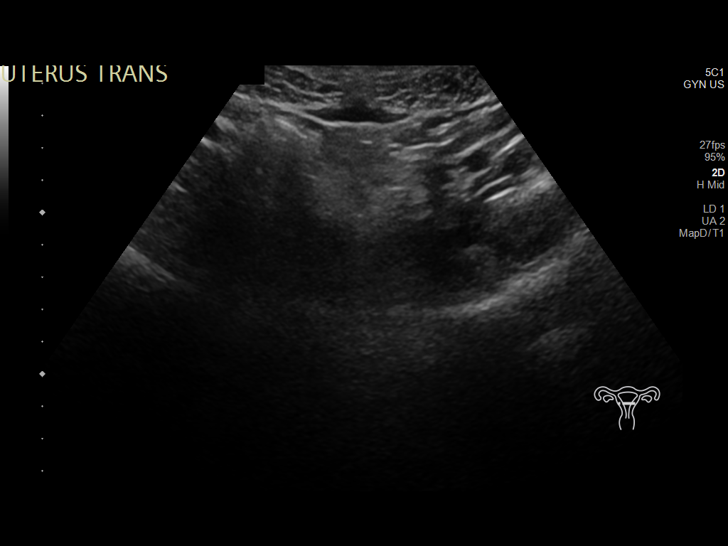
[im 19/73]
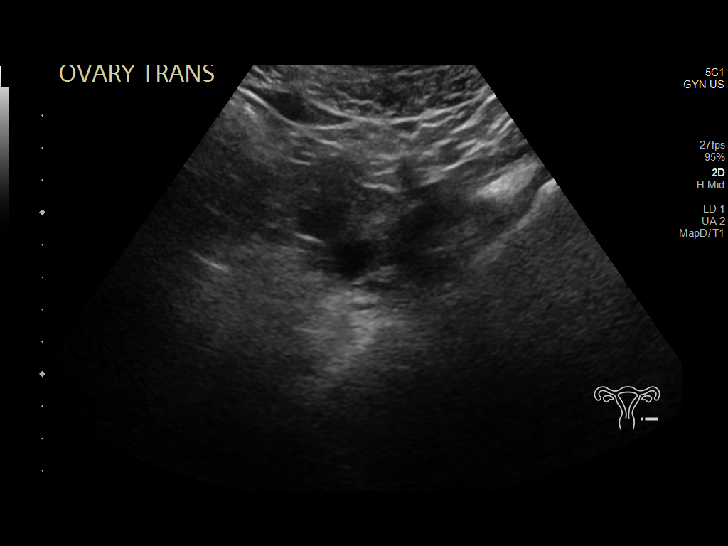
[im 25/73]
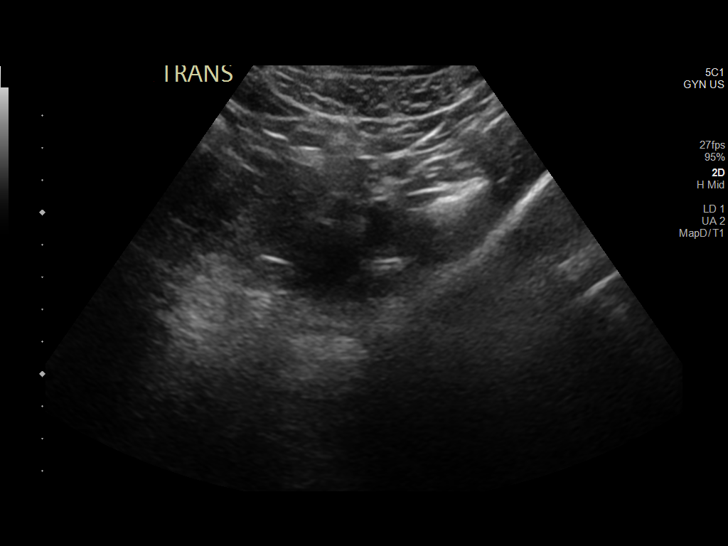
[im 31/73]
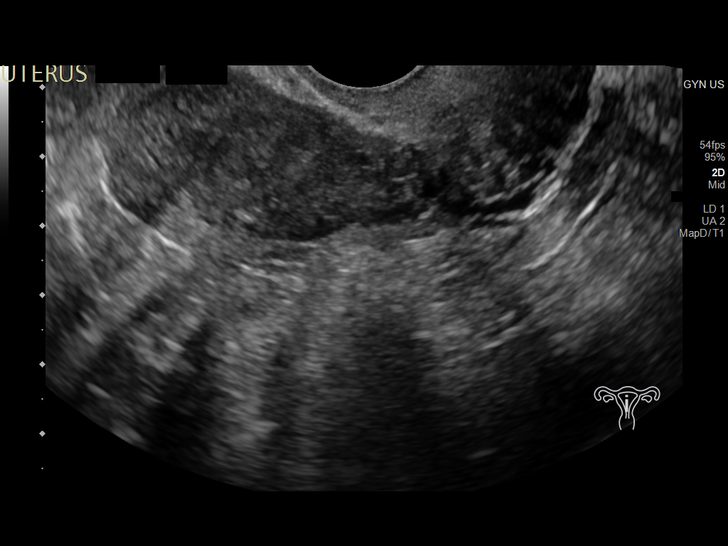
[im 37/73]
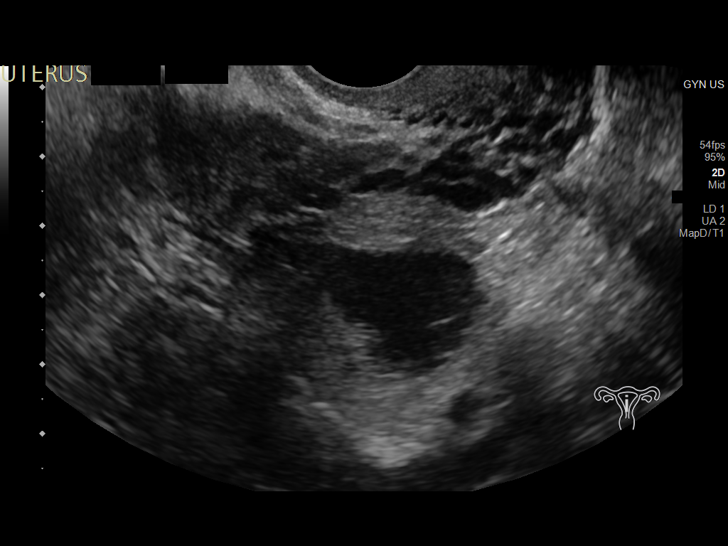
[im 43/73]
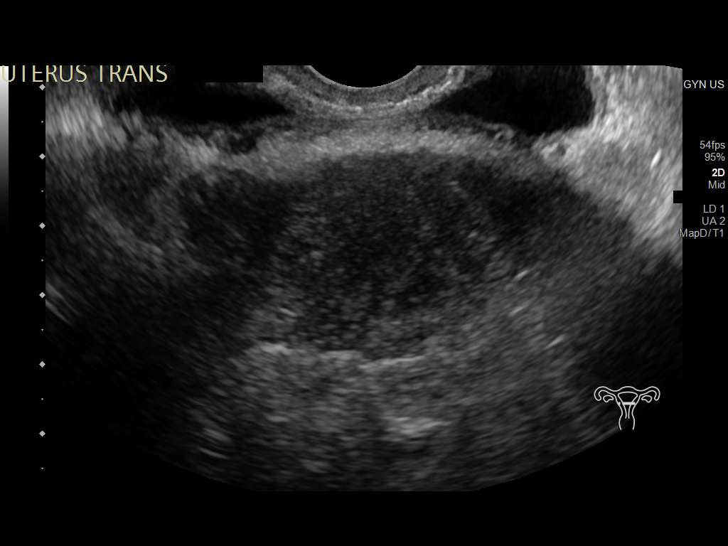
[im 49/73]
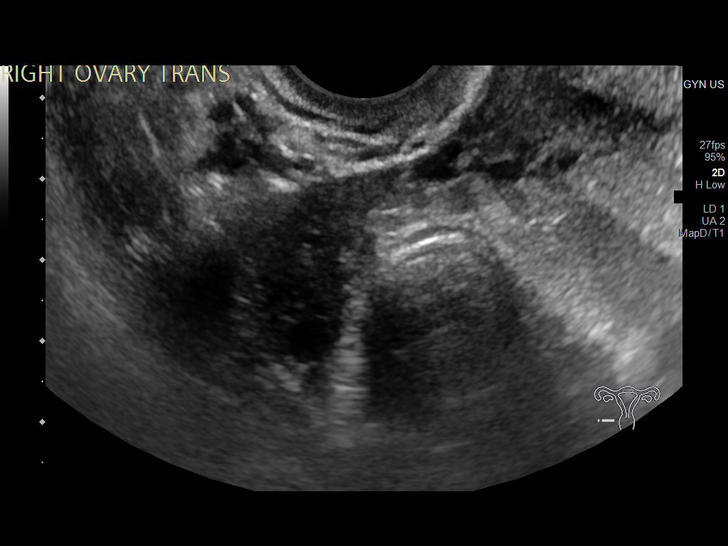
[im 55/73]
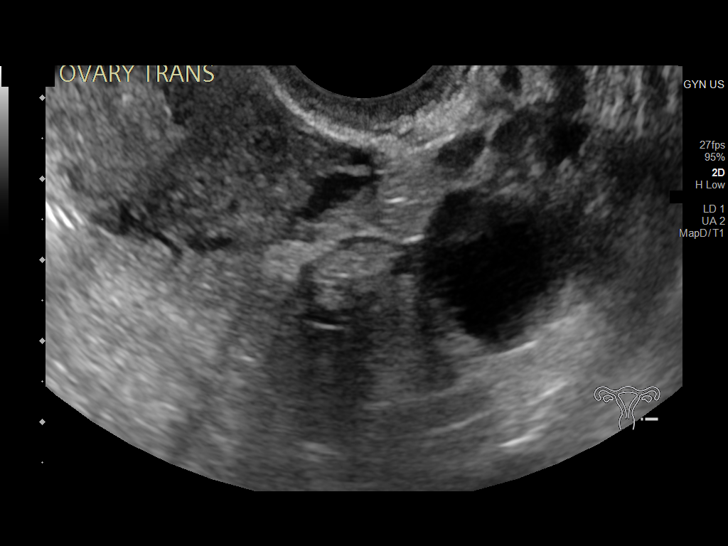
[im 61/73]
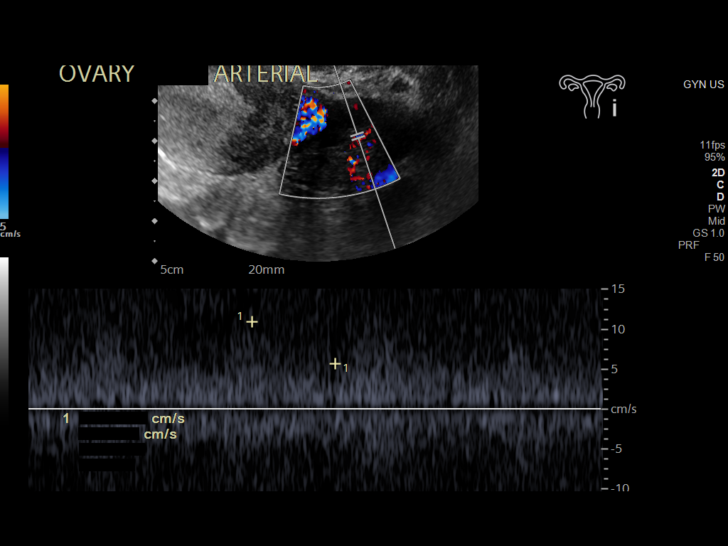
[im 67/73]
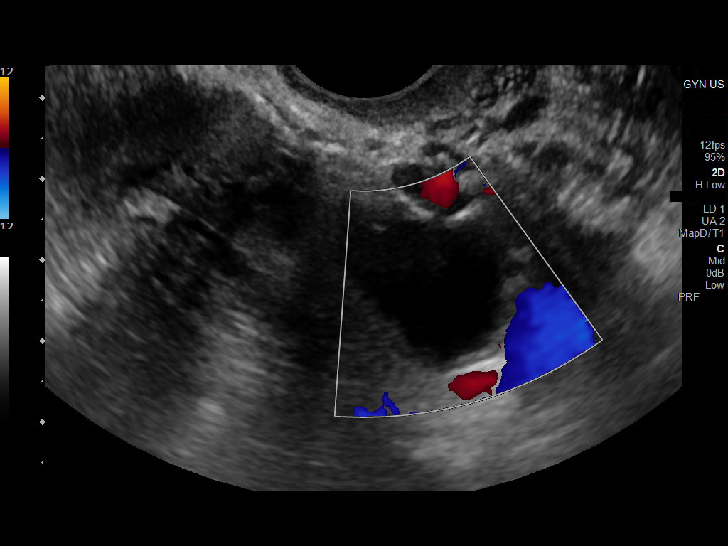
[im 73/73]
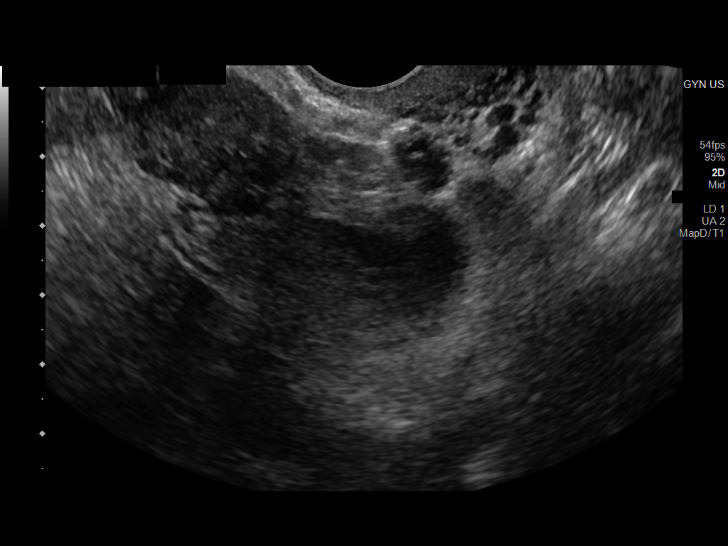

[13 of 25 positions shown; findings below may reference images not displayed]

FINDINGS: Uterus

Measurements: 7.0 x 3.6 x 4.5 cm = volume: 59 mL. No fibroids or
other mass visualized.

Endometrium

Thickness: 6 mm.  No focal abnormality visualized.

Right ovary

Measurements: 2.9 x 1.3 x 1.8 cm = volume: 3.4 mL. Normal
appearance/no adnexal mass.

Left ovary

Measurements: 2.8 x 1.7 x 2.3 cm = volume: 5.8 mL. Normal
appearance/no adnexal mass.

Pulsed Doppler evaluation of both ovaries demonstrates normal
low-resistance arterial and venous waveforms.

Other findings

No abnormal free fluid.
IMPRESSION: Normal study.

## 2023-04-17 ENCOUNTER — Ambulatory Visit
Admission: EM | Admit: 2023-04-17 | Discharge: 2023-04-17 | Disposition: A | Discharge status: Home / Self Care | Payer: Medicaid Other | Attending: Internal Medicine | Admitting: Internal Medicine

## 2023-04-17 DIAGNOSIS — Z862 Personal history of diseases of the blood and blood-forming organs and certain disorders involving the immune mechanism: Secondary | ICD-10-CM | POA: Diagnosis present

## 2023-04-17 DIAGNOSIS — Z113 Encounter for screening for infections with a predominantly sexual mode of transmission: Secondary | ICD-10-CM | POA: Diagnosis present

## 2023-04-17 NOTE — Discharge Instructions (Addendum)
We will let you know about your test results tomorrow and if you need any kind of treatment for any infection. Follow up with your PCP to check your iron levels.

## 2023-04-17 NOTE — ED Provider Notes (Signed)
[facility]  Note:  This document was prepared using Conservation officer, historic buildings and may include unintentional dictation errors.  MRN: [id number] DOB: 2001/02/09  Subjective:   Carrie Roman is a 22 y.o. female presenting for STI testing.  Patient has a new sex partner.  Would like HIV and syphilis testing too.  She has a history of iron deficiency anemia, would like her blood cell counts checked.  Denies fever, n/v, abdominal pain, pelvic pain, rashes, dysuria, urinary frequency, hematuria, vaginal discharge.    No current facility-administered medications for this encounter. No current outpatient medications on file.   Allergies  Allergen Reactions   Septra [Sulfamethoxazole-Trimethoprim] Rash    Past Medical History:  Diagnosis Date   Anemia      History reviewed. No pertinent surgical history.  Family History  Problem Relation Age of Onset   Asthma Mother    Healthy Father     Social History   Tobacco Use   Smoking status: Never   Smokeless tobacco: Former  Building services engineer status: Never Used  Substance Use Topics   Alcohol use: No   Drug use: No    ROS   Objective:   Vitals: BP 113/76 (BP Location: Right Arm)   Pulse 88   Temp 99.2 F (37.3 C) (Oral)   Resp 16   LMP  (Within Weeks) Comment: 1 week  SpO2 99%   Physical Exam Constitutional:      General: She is not in acute distress.    Appearance: Normal appearance. She is well-developed. She is not ill-appearing, toxic-appearing or diaphoretic.  HENT:     Head: Normocephalic and atraumatic.     Nose: Nose normal.     Mouth/Throat:     Mouth: Mucous membranes are moist.  Eyes:     General: No scleral icterus.       Right eye: No discharge.        Left eye: No discharge.     Extraocular Movements: Extraocular movements intact.  Cardiovascular:     Rate and Rhythm: Normal rate.  Pulmonary:     Effort: Pulmonary effort is normal.  Skin:    General: Skin  is warm and dry.  Neurological:     General: No focal deficit present.     Mental Status: She is alert and oriented to person, place, and time.  Psychiatric:        Mood and Affect: Mood normal.        Behavior: Behavior normal.     Assessment and Plan :   PDMP not reviewed this encounter.  1. Screen for STD (sexually transmitted disease)   2. History of iron deficiency anemia    Labs pending, will treat as appropriate based off of lab results.  CBC pending, recommended follow-up with her PCP.   Wallis Bamberg, PA-C 04/17/23 1059

## 2023-04-17 NOTE — ED Triage Notes (Signed)
Pt requested STD's test.

## 2023-04-18 LAB — CERVICOVAGINAL ANCILLARY ONLY
Bacterial Vaginitis (gardnerella): POSITIVE — AB
Candida Glabrata: NEGATIVE
Candida Vaginitis: NEGATIVE
Chlamydia: NEGATIVE
Comment: NEGATIVE
Comment: NEGATIVE
Comment: NEGATIVE
Comment: NEGATIVE
Comment: NEGATIVE
Comment: NORMAL
Neisseria Gonorrhea: NEGATIVE
Trichomonas: NEGATIVE

## 2023-04-18 LAB — CBC
Hematocrit: 39.4 % (ref 34.0–46.6)
Hemoglobin: 12.6 g/dL (ref 11.1–15.9)
MCH: 27 pg (ref 26.6–33.0)
MCHC: 32 g/dL (ref 31.5–35.7)
MCV: 85 fL (ref 79–97)
Platelets: 338 10*3/uL (ref 150–450)
RBC: 4.66 x10E6/uL (ref 3.77–5.28)
RDW: 13 % (ref 11.7–15.4)
WBC: 7.7 10*3/uL (ref 3.4–10.8)

## 2023-04-18 LAB — RPR: RPR Ser Ql: NONREACTIVE

## 2023-04-18 LAB — HIV ANTIBODY (ROUTINE TESTING W REFLEX): HIV Screen 4th Generation wRfx: NONREACTIVE

## 2023-04-20 ENCOUNTER — Telehealth: Payer: Self-pay

## 2023-04-20 MED ORDER — METRONIDAZOLE 500 MG PO TABS: 500.0000 mg | ORAL_TABLET | Freq: Two times a day (BID) | ORAL | 0 refills | Status: AC

## 2023-04-20 NOTE — Telephone Encounter (Signed)
 Per protocol, pt requires tx with metronidazole. Attempted to reach patient x1. LVM. Rx sent to pharmacy on file.

## 2023-12-21 ENCOUNTER — Ambulatory Visit
Admission: RE | Admit: 2023-12-21 | Discharge: 2023-12-21 | Disposition: A | Discharge status: Home / Self Care | Source: Ambulatory Visit | Attending: Family Medicine | Admitting: Family Medicine

## 2023-12-21 VITALS — BP 121/78 | HR 75 | Temp 98.0°F | Resp 18

## 2023-12-21 DIAGNOSIS — Z113 Encounter for screening for infections with a predominantly sexual mode of transmission: Secondary | ICD-10-CM | POA: Diagnosis present

## 2023-12-21 NOTE — ED Provider Notes (Signed)
 [facility]    CSN: [id number] Arrival date & time: 12/21/23  1156      History   Chief Complaint Chief Complaint  Patient presents with   Exposure to STD    HPI Carrie Roman is a 23 y.o. female presents for STD screening.  Patient denies any symptoms including vaginal discharge, dysuria, fevers, nausea/vomiting.  No known STD exposure concern but she does have a new sexual partner.  No other concerns at this time.   Exposure to STD    Past Medical History:  Diagnosis Date   Anemia     Patient Active Problem List   Diagnosis Date Noted   Pre-diabetes 12/25/2019   Hyperlipidemia 12/25/2019   Low HDL (under 40) 12/25/2019    History reviewed. No pertinent surgical history.  OB History     Gravida  0   Para  0   Term  0   Preterm  0   AB  0   Living  0      SAB  0   IAB  0   Ectopic  0   Multiple  0   Live Births  0            Home Medications    Prior to Admission medications   Not on File    Family History Family History  Problem Relation Age of Onset   Asthma Mother    Healthy Father     Social History Social History   Tobacco Use   Smoking status: Never   Smokeless tobacco: Former  Building services engineer status: Never Used  Substance Use Topics   Alcohol use: No   Drug use: No     Allergies   Septra  [sulfamethoxazole -trimethoprim ]   Review of Systems Review of Systems  Genitourinary:        STD screening     Physical Exam Triage Vital Signs ED Triage Vitals  Encounter Vitals Group     BP 12/21/23 1214 121/78     Systolic BP Percentile --      Diastolic BP Percentile --      Pulse Rate 12/21/23 1214 75     Resp 12/21/23 1214 18     Temp 12/21/23 1214 98 F (36.7 C)     Temp Source 12/21/23 1214 Oral     SpO2 12/21/23 1214 94 %     Weight --      Height --      Head Circumference --      Peak Flow --      Pain Score 12/21/23 1213 0     Pain Loc --      Pain Education --      Exclude  from Growth Chart --    No data found.  Updated Vital Signs BP 121/78 (BP Location: Left Arm)   Pulse 75   Temp 98 F (36.7 C) (Oral)   Resp 18   LMP 12/21/2023   SpO2 94%   Visual Acuity Right Eye Distance:   Left Eye Distance:   Bilateral Distance:    Right Eye Near:   Left Eye Near:    Bilateral Near:     Physical Exam Vitals and nursing note reviewed.  Constitutional:      Appearance: Normal appearance.  HENT:     Head: Normocephalic and atraumatic.  Eyes:     Pupils: Pupils are equal, round, and reactive to light.  Cardiovascular:     Rate  and Rhythm: Normal rate.  Pulmonary:     Effort: Pulmonary effort is normal.  Skin:    General: Skin is warm and dry.  Neurological:     General: No focal deficit present.     Mental Status: She is alert and oriented to person, place, and time.  Psychiatric:        Mood and Affect: Mood normal.        Behavior: Behavior normal.      UC Treatments / Results  Labs (all labs ordered are listed, but only abnormal results are displayed) Labs Reviewed  RPR  HIV ANTIBODY (ROUTINE TESTING W REFLEX)  CERVICOVAGINAL ANCILLARY ONLY    EKG   Radiology No results found.  Procedures Procedures (including critical care time)  Medications Ordered in UC Medications - No data to display  Initial Impression / Assessment and Plan / UC Course  I have reviewed the triage vital signs and the nursing notes.  Pertinent labs & imaging results that were available during my care of the patient were reviewed by me and considered in my medical decision making (see chart for details).     STD testing is ordered and will contact for any positive results.  Patient to follow-up as needed. Final Clinical Impressions(s) / UC Diagnoses   Final diagnoses:  Screening examination for STD (sexually transmitted disease)     Discharge Instructions      The clinic will contact you with results of the testing done today if  positive.   Follow-up as needed.  ED Prescriptions   None    PDMP not reviewed this encounter.   Alleen Arbour, NP 12/21/23 1240

## 2023-12-21 NOTE — ED Triage Notes (Signed)
 Pt is here to get tested for STD, pt denies any symptoms to just would like to get tested for all STD's

## 2023-12-21 NOTE — Discharge Instructions (Addendum)
The clinic will contact you with results of the testing done today if positive Follow-up as needed

## 2023-12-22 LAB — HIV ANTIBODY (ROUTINE TESTING W REFLEX): HIV Screen 4th Generation wRfx: NONREACTIVE

## 2023-12-22 LAB — RPR: RPR Ser Ql: NONREACTIVE

## 2023-12-24 LAB — CERVICOVAGINAL ANCILLARY ONLY
Chlamydia: NEGATIVE
Comment: NEGATIVE
Comment: NEGATIVE
Comment: NORMAL
Neisseria Gonorrhea: NEGATIVE
Trichomonas: NEGATIVE

## 2024-02-17 ENCOUNTER — Encounter (HOSPITAL_COMMUNITY): Payer: Self-pay | Admitting: *Deleted

## 2024-02-17 ENCOUNTER — Ambulatory Visit (INDEPENDENT_AMBULATORY_CARE_PROVIDER_SITE_OTHER)

## 2024-02-17 ENCOUNTER — Other Ambulatory Visit: Payer: Self-pay

## 2024-02-17 ENCOUNTER — Ambulatory Visit (HOSPITAL_COMMUNITY)
Admission: EM | Admit: 2024-02-17 | Discharge: 2024-02-17 | Disposition: A | Discharge status: Home / Self Care | Attending: Physician Assistant | Admitting: Physician Assistant

## 2024-02-17 DIAGNOSIS — R519 Headache, unspecified: Secondary | ICD-10-CM

## 2024-02-17 MED ORDER — IBUPROFEN 800 MG PO TABS
800.0000 mg | ORAL_TABLET | Freq: Three times a day (TID) | ORAL | 0 refills | Status: AC
Start: 2024-02-17 — End: ?

## 2024-02-17 NOTE — Discharge Instructions (Signed)
 Please follow-up with your primary care provider this week.  We will contact you if there are any abnormal findings on x-ray.  Please take ibuprofen  as directed to help with pain and swelling.  You may also apply ice to the face to help.  Take tomorrow off of work to rest.  Very low threshold to present to the emergency department should you suddenly develop severe pain around the eye or pressure, decreased vision, significant headache, pain with movement of the eyes, dizziness, or any other worsening or change in symptoms.SABRA
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
